# Patient Record
Sex: Female | Born: 1960
Health system: Southern US, Community
[De-identification: ages and names within clinical notes are randomized; demographics above are authoritative.]

## PROBLEM LIST (undated history)

## (undated) DIAGNOSIS — F419 Anxiety disorder, unspecified: Secondary | ICD-10-CM

## (undated) DIAGNOSIS — Q211 Atrial septal defect, unspecified: Secondary | ICD-10-CM

## (undated) DIAGNOSIS — Z8669 Personal history of other diseases of the nervous system and sense organs: Secondary | ICD-10-CM

## (undated) DIAGNOSIS — R51 Headache: Secondary | ICD-10-CM

## (undated) DIAGNOSIS — I493 Ventricular premature depolarization: Secondary | ICD-10-CM

## (undated) DIAGNOSIS — R112 Nausea with vomiting, unspecified: Secondary | ICD-10-CM

## (undated) DIAGNOSIS — I499 Cardiac arrhythmia, unspecified: Secondary | ICD-10-CM

## (undated) DIAGNOSIS — Z9889 Other specified postprocedural states: Secondary | ICD-10-CM

## (undated) DIAGNOSIS — R011 Cardiac murmur, unspecified: Secondary | ICD-10-CM

## (undated) HISTORY — PX: OTHER SURGICAL HISTORY: SHX169

## (undated) HISTORY — DX: Cardiac murmur, unspecified: R01.1

## (undated) HISTORY — PX: POSTERIOR REPAIR: SHX2254

## (undated) HISTORY — DX: Cardiac arrhythmia, unspecified: I49.9

## (undated) HISTORY — DX: Personal history of other diseases of the nervous system and sense organs: Z86.69

## (undated) HISTORY — PX: CARPAL TUNNEL RELEASE: SHX101

## (undated) HISTORY — PX: LAPAROSCOPY: SHX197

## (undated) HISTORY — PX: TUBAL LIGATION: SHX77

## (undated) HISTORY — PX: WISDOM TOOTH EXTRACTION: SHX21

---

## 2002-02-01 ENCOUNTER — Ambulatory Visit: Admission: RE | Admit: 2002-02-01 | Discharge: 2002-02-01 | Payer: Self-pay | Admitting: Family Medicine

## 2002-04-01 ENCOUNTER — Other Ambulatory Visit: Admission: RE | Admit: 2002-04-01 | Discharge: 2002-04-01 | Payer: Self-pay | Admitting: Obstetrics and Gynecology

## 2002-04-17 ENCOUNTER — Encounter: Payer: Self-pay | Admitting: Obstetrics and Gynecology

## 2002-04-17 ENCOUNTER — Encounter: Admission: RE | Admit: 2002-04-17 | Discharge: 2002-04-17 | Payer: Self-pay | Admitting: Obstetrics and Gynecology

## 2002-04-26 ENCOUNTER — Encounter: Payer: Self-pay | Admitting: Obstetrics and Gynecology

## 2002-04-26 ENCOUNTER — Encounter: Admission: RE | Admit: 2002-04-26 | Discharge: 2002-04-26 | Payer: Self-pay | Admitting: Obstetrics and Gynecology

## 2003-05-29 ENCOUNTER — Other Ambulatory Visit: Admission: RE | Admit: 2003-05-29 | Discharge: 2003-05-29 | Payer: Self-pay | Admitting: Obstetrics and Gynecology

## 2003-06-11 ENCOUNTER — Encounter: Admission: RE | Admit: 2003-06-11 | Discharge: 2003-06-11 | Payer: Self-pay | Admitting: Obstetrics and Gynecology

## 2003-06-11 ENCOUNTER — Encounter: Payer: Self-pay | Admitting: Obstetrics and Gynecology

## 2004-07-22 ENCOUNTER — Other Ambulatory Visit: Admission: RE | Admit: 2004-07-22 | Discharge: 2004-07-22 | Payer: Self-pay | Admitting: Obstetrics and Gynecology

## 2005-10-31 ENCOUNTER — Other Ambulatory Visit: Admission: RE | Admit: 2005-10-31 | Discharge: 2005-10-31 | Payer: Self-pay | Admitting: Obstetrics and Gynecology

## 2005-11-30 ENCOUNTER — Encounter: Admission: RE | Admit: 2005-11-30 | Discharge: 2005-11-30 | Payer: Self-pay | Admitting: Obstetrics and Gynecology

## 2008-10-07 ENCOUNTER — Encounter: Admission: RE | Admit: 2008-10-07 | Discharge: 2008-10-07 | Payer: Self-pay | Admitting: Obstetrics and Gynecology

## 2008-10-14 ENCOUNTER — Encounter: Admission: RE | Admit: 2008-10-14 | Discharge: 2008-10-14 | Payer: Self-pay | Admitting: Obstetrics and Gynecology

## 2010-08-19 ENCOUNTER — Encounter: Admission: RE | Admit: 2010-08-19 | Discharge: 2010-08-19 | Payer: Self-pay | Admitting: Obstetrics and Gynecology

## 2010-12-16 ENCOUNTER — Ambulatory Visit (HOSPITAL_COMMUNITY)
Admission: RE | Admit: 2010-12-16 | Discharge: 2010-12-16 | Payer: Self-pay | Source: Home / Self Care | Attending: Obstetrics and Gynecology | Admitting: Obstetrics and Gynecology

## 2011-04-01 ENCOUNTER — Ambulatory Visit: Payer: Self-pay | Admitting: Cardiology

## 2011-05-26 ENCOUNTER — Encounter: Payer: Self-pay | Admitting: Cardiology

## 2011-05-26 ENCOUNTER — Ambulatory Visit (INDEPENDENT_AMBULATORY_CARE_PROVIDER_SITE_OTHER): Payer: 59 | Admitting: Cardiology

## 2011-05-26 VITALS — BP 152/95 | HR 74 | Ht 63.0 in | Wt 141.8 lb

## 2011-05-26 DIAGNOSIS — Q211 Atrial septal defect: Secondary | ICD-10-CM

## 2011-05-26 DIAGNOSIS — I272 Pulmonary hypertension, unspecified: Secondary | ICD-10-CM

## 2011-05-26 DIAGNOSIS — I2789 Other specified pulmonary heart diseases: Secondary | ICD-10-CM

## 2011-05-26 NOTE — Patient Instructions (Signed)
Your physician recommends that you schedule a follow-up appointment in: 3 WEEKS WITH DR [facility]  Your physician has requested that you have an echocardiogram. Echocardiography is a painless test that uses sound waves to create images of your heart. It provides your doctor with information about the size and shape of your heart and how well your heart's chambers and valves are working. This procedure takes approximately one hour. There are no restrictions for this procedure. DX PULMONARY HYPERTENSION  Your physician recommends that you continue on your current medications as directed. Please refer to the Current Medication list given to you today.

## 2011-05-27 ENCOUNTER — Telehealth: Payer: Self-pay | Admitting: *Deleted

## 2011-05-27 DIAGNOSIS — Q211 Atrial septal defect, unspecified: Secondary | ICD-10-CM | POA: Insufficient documentation

## 2011-05-27 NOTE — Assessment & Plan Note (Signed)
Patient has a history of a small ASD seen by prior echo.  She does not some increased dyspnea with heavy exertion over the last year or so, but in general, she has very good exercise tolerance.  Left to right shunting can certainly increase over time as systemic blood pressure rises.  I will get an echocardiogram to assess for any dilation or dysfunction of the right ventricle and to assess PA pressure.  If the right side of her heart does appear enlarged, I will likely send her for a TEE with an eye towards percutaneous closure of the ASD.

## 2011-05-27 NOTE — Progress Notes (Signed)
PCP: Dr. Rosezetta Schlatter  50 yo with history of small ASD noted on prior echo a number of years ago presents for cardiology followup.  She has seen Dr. Deborah Chalk in the past.  She works as an Psychologist, educational at [employer].  Over the last year, she does report increased exertional dyspnea.  She feels like she is winded more easily with heavy yardwork and with running.  Overall, however, her exercise tolerance is excellent.  No syncope/lightheadedness.  No chest pain.  BP is high today, she attributes it to anxiety.  She checks her BP from time to time at work and systolic runs 120s-130s.   ECG: NSR, normal  PMH: 1. ASD: Small on prior echoes, last was several years ago.  2. PVCs 3. Anxiety  SH: Lives in [place], works as Psychologist, educational at [employer], has children, nonsmoker.    FH: Father with MI at 67, brother with ASD  ROS: All systems reviewed and negative except as per HPI.   Current Outpatient Prescriptions  Medication Sig Dispense Refill  . calcium-vitamin D (OSCAL WITH D) 500-200 MG-UNIT per tablet Take 1 tablet by mouth daily.        . Multiple Vitamins-Minerals (MULTIVITAMIN WITH MINERALS) tablet Take 1 tablet by mouth daily.          Pulse 74  Ht 5\' 3"  (1.6 m)  Wt 141 lb 12.8 oz (64.32 kg)  BMI 25.12 kg/m2 General: NAD Neck: No JVD, no thyromegaly or thyroid nodule.  Lungs: Clear to auscultation bilaterally with normal respiratory effort. CV: Nondisplaced PMI.  Heart regular S1/S2, no S3/S4, 1/6 SEM LLSB.  No peripheral edema.  No carotid bruit.  Normal pedal pulses.  Abdomen: Soft, nontender, no hepatosplenomegaly, no distention.  Skin: Intact without lesions or rashes.  Neurologic: Alert and oriented x 3.  Psych: Normal affect. Extremities: No clubbing or cyanosis.  HEENT: Normal.

## 2011-05-27 NOTE — Telephone Encounter (Signed)
Per pt she will set up her echo @ [facility] - She works in Radiology.

## 2011-05-30 ENCOUNTER — Ambulatory Visit (HOSPITAL_COMMUNITY)
Admission: RE | Admit: 2011-05-30 | Discharge: 2011-05-30 | Disposition: A | Discharge status: Home / Self Care | Payer: 59 | Source: Ambulatory Visit | Attending: Cardiology | Admitting: Cardiology

## 2011-05-30 DIAGNOSIS — Q211 Atrial septal defect: Secondary | ICD-10-CM | POA: Insufficient documentation

## 2011-05-30 DIAGNOSIS — Q2111 Secundum atrial septal defect: Secondary | ICD-10-CM

## 2011-05-30 DIAGNOSIS — I519 Heart disease, unspecified: Secondary | ICD-10-CM | POA: Insufficient documentation

## 2011-06-14 ENCOUNTER — Ambulatory Visit (INDEPENDENT_AMBULATORY_CARE_PROVIDER_SITE_OTHER): Payer: 59 | Admitting: Cardiology

## 2011-06-14 ENCOUNTER — Encounter: Payer: Self-pay | Admitting: Cardiology

## 2011-06-14 DIAGNOSIS — I10 Essential (primary) hypertension: Secondary | ICD-10-CM

## 2011-06-14 DIAGNOSIS — Q211 Atrial septal defect: Secondary | ICD-10-CM

## 2011-06-15 ENCOUNTER — Encounter: Payer: Self-pay | Admitting: Physician Assistant

## 2011-06-16 ENCOUNTER — Other Ambulatory Visit: Payer: 59 | Admitting: *Deleted

## 2011-06-16 ENCOUNTER — Ambulatory Visit: Payer: 59 | Admitting: Cardiology

## 2011-06-16 ENCOUNTER — Ambulatory Visit: Payer: 59 | Admitting: Physician Assistant

## 2011-06-16 DIAGNOSIS — I10 Essential (primary) hypertension: Secondary | ICD-10-CM | POA: Insufficient documentation

## 2011-06-16 NOTE — Assessment & Plan Note (Signed)
BP has been mildly elevated here in the office but she checks it several times a week at work and it has been within normal range.  Will follow carefully.

## 2011-06-16 NOTE — Assessment & Plan Note (Signed)
Echo appeared to show a small ASD versus large PFO.  There did not appear to be hemodynamic effect: RV was normal in size and function, RA was normal in size.  She has good exercise tolerance.  I do not think that we need to do a TEE at this point.  I will follow her symptomatically with a return appointment in 1 year.  If no new symptoms, repeat echo in 2 years.

## 2011-06-16 NOTE — Progress Notes (Signed)
PCP: Dr. Rosezetta Schlatter  50 yo with history of small ASD noted on prior echo a number of years ago presents for cardiology followup.  She works as an Psychologist, educational at [employer].  Over the last year, she feels like she is winded more easily with heavy yardwork and with running.  Overall, however, her exercise tolerance is excellent.  No syncope/lightheadedness.  No chest pain.  BP is high today, but she checks her BP from time to time at work and systolic runs 120s-130s.   She had an echo done this month which I reviewed today.  There does appear to be a small ASD versus large PFO.  The right ventricle and right atrium are normal in size.  Unable to estimate PA systolic pressure from this study.    ECG: NSR, normal (6/12)  PMH: 1. ASD: Echo (7/12) with EF 55-60%, normal LA size, normal RA size, normal RV size and systolic function, unable to estimate PA systolic pressure on this study, small ASD versus large PFO.  2. PVCs 3. 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    FH: Father with MI at 16, brother with ASD  Current Outpatient Prescriptions  Medication Sig Dispense Refill  . calcium-vitamin D (OSCAL WITH D) 500-200 MG-UNIT per tablet Take 1 tablet by mouth daily.        . Multiple Vitamins-Minerals (MULTIVITAMIN WITH MINERALS) tablet Take 1 tablet by mouth daily.          BP 148/98  Pulse 80  Resp 18  Ht 5\' 3"  (1.6 m)  Wt 141 lb (63.957 kg)  BMI 24.98 kg/m2 General: NAD Neck: No JVD, no thyromegaly or thyroid nodule.  Lungs: Clear to auscultation bilaterally with normal respiratory effort. CV: Nondisplaced PMI.  Heart regular S1/S2, no S3/S4, no murmur.  No peripheral edema.  No carotid bruit.  Normal pedal pulses.  Abdomen: Soft, nontender, no hepatosplenomegaly, no distention.  Neurologic: Alert and oriented x 3.  Psych: Normal affect. Extremities: No clubbing or cyanosis.

## 2011-08-30 ENCOUNTER — Other Ambulatory Visit (HOSPITAL_COMMUNITY): Payer: 59

## 2011-08-30 NOTE — Patient Instructions (Addendum)
   Your procedure is scheduled on:  Tuesday, September 06, 2011  Enter through the [employer] [facility] at: 8:30am Pick up the phone at the desk and dial [phone] and inform us of your arrival  Please call this number if you have any problems the morning of surgery: [phone]  Remember: Do not eat food after midnight  Monday, Oct. 8th Do not drink clear liquids after: Monday, Oct. 8th Take these medicines the morning of surgery with a SIP OF WATER: none  Do not wear jewelry, make-up, or FINGER nail polish Do not wear lotions, powders, or perfumes.  You may wear deodorant. Do not shave 48 hours prior to surgery. Do not bring valuables to the hospital.  Patients discharged on the day of surgery will not be allowed to drive home.   Name and phone number of your driver:  Shantee Hayne  cell  [phone]   Remember to use your hibiclens as instructed.Please shower with 1/2 bottle the evening before your surgery and the other 1/2 bottle the morning of surgery.

## 2011-09-01 ENCOUNTER — Encounter (HOSPITAL_COMMUNITY): Payer: Self-pay

## 2011-09-01 ENCOUNTER — Encounter (HOSPITAL_COMMUNITY)
Admission: RE | Admit: 2011-09-01 | Discharge: 2011-09-01 | Disposition: A | Discharge status: Home / Self Care | Payer: 59 | Source: Ambulatory Visit | Attending: Obstetrics and Gynecology | Admitting: Obstetrics and Gynecology

## 2011-09-01 HISTORY — DX: Other specified postprocedural states: Z98.890

## 2011-09-01 HISTORY — DX: Headache: R51

## 2011-09-01 HISTORY — DX: Other specified postprocedural states: R11.2

## 2011-09-01 HISTORY — DX: Anxiety disorder, unspecified: F41.9

## 2011-09-01 HISTORY — DX: Atrial septal defect: Q21.1

## 2011-09-01 HISTORY — DX: Atrial septal defect, unspecified: Q21.10

## 2011-09-01 HISTORY — DX: Ventricular premature depolarization: I49.3

## 2011-09-01 LAB — BASIC METABOLIC PANEL
BUN: 13 mg/dL (ref 6–23)
Chloride: 100 mEq/L (ref 96–112)
GFR calc Af Amer: >90 mL/min (ref >90)
Potassium: 4 mEq/L (ref 3.5–5.1)

## 2011-09-01 LAB — CBC
HCT: 43.3 % (ref 36.0–46.0)
Hemoglobin: 14.7 g/dL (ref 12.0–15.0)
WBC: 11.4 10*3/uL — ABNORMAL HIGH (ref 4.0–10.5)

## 2011-09-01 NOTE — Pre-Procedure Instructions (Signed)
Reviewed patient's history via phone with Dr Malen Gauze.  Ok to see patient DOS.  Copy of Echo and EKG on chart.  Patient has Atrial Septal Defect.

## 2011-09-06 ENCOUNTER — Encounter (HOSPITAL_COMMUNITY)
Admission: RE | Disposition: A | Discharge status: Home / Self Care | Payer: Self-pay | Source: Ambulatory Visit | Attending: Obstetrics and Gynecology

## 2011-09-06 ENCOUNTER — Ambulatory Visit (HOSPITAL_COMMUNITY): Payer: 59 | Admitting: Anesthesiology

## 2011-09-06 ENCOUNTER — Ambulatory Visit (HOSPITAL_COMMUNITY)
Admission: RE | Admit: 2011-09-06 | Discharge: 2011-09-06 | Disposition: A | Discharge status: Home / Self Care | Payer: 59 | Source: Ambulatory Visit | Attending: Obstetrics and Gynecology | Admitting: Obstetrics and Gynecology

## 2011-09-06 ENCOUNTER — Encounter (HOSPITAL_COMMUNITY): Payer: Self-pay | Admitting: *Deleted

## 2011-09-06 ENCOUNTER — Encounter (HOSPITAL_COMMUNITY): Payer: Self-pay | Admitting: Anesthesiology

## 2011-09-06 ENCOUNTER — Other Ambulatory Visit: Payer: Self-pay | Admitting: Obstetrics and Gynecology

## 2011-09-06 DIAGNOSIS — D251 Intramural leiomyoma of uterus: Secondary | ICD-10-CM | POA: Insufficient documentation

## 2011-09-06 DIAGNOSIS — N920 Excessive and frequent menstruation with regular cycle: Secondary | ICD-10-CM | POA: Insufficient documentation

## 2011-09-06 DIAGNOSIS — Z01812 Encounter for preprocedural laboratory examination: Secondary | ICD-10-CM | POA: Insufficient documentation

## 2011-09-06 DIAGNOSIS — Z01818 Encounter for other preprocedural examination: Secondary | ICD-10-CM | POA: Insufficient documentation

## 2011-09-06 SURGERY — DILATATION & CURETTAGE/HYSTEROSCOPY WITH HYDROTHERMAL ABLATION
Anesthesia: General | Site: Vagina | Wound class: Clean Contaminated

## 2011-09-06 MED ORDER — MIDAZOLAM HCL 2 MG/2ML IJ SOLN
INTRAMUSCULAR | Status: AC
  Filled 2011-09-06: qty 2

## 2011-09-06 MED ORDER — ONDANSETRON HCL 4 MG/2ML IJ SOLN
INTRAMUSCULAR | Status: DC | PRN
  Administered 2011-09-06: 4 mg via INTRAVENOUS

## 2011-09-06 MED ORDER — LIDOCAINE HCL (CARDIAC) 20 MG/ML IV SOLN
INTRAVENOUS | Status: DC | PRN
  Administered 2011-09-06: 50 mg via INTRAVENOUS

## 2011-09-06 MED ORDER — DEXAMETHASONE SODIUM PHOSPHATE 10 MG/ML IJ SOLN
INTRAMUSCULAR | Status: AC
  Filled 2011-09-06: qty 1

## 2011-09-06 MED ORDER — DEXAMETHASONE SODIUM PHOSPHATE 4 MG/ML IJ SOLN
INTRAMUSCULAR | Status: DC | PRN
  Administered 2011-09-06: 6 mg via INTRAVENOUS

## 2011-09-06 MED ORDER — ONDANSETRON HCL 4 MG/2ML IJ SOLN
INTRAMUSCULAR | Status: AC
  Filled 2011-09-06: qty 2

## 2011-09-06 MED ORDER — LIDOCAINE HCL (CARDIAC) 20 MG/ML IV SOLN
INTRAVENOUS | Status: AC
  Filled 2011-09-06: qty 5

## 2011-09-06 MED ORDER — SCOPOLAMINE 1 MG/3DAYS TD PT72
MEDICATED_PATCH | TRANSDERMAL | Status: AC
  Filled 2011-09-06: qty 1

## 2011-09-06 MED ORDER — FENTANYL CITRATE 0.05 MG/ML IJ SOLN
1.0000 ug/kg | INTRAMUSCULAR | Status: DC | PRN
  Administered 2011-09-06: 50 ug via INTRAVENOUS

## 2011-09-06 MED ORDER — FENTANYL CITRATE 0.05 MG/ML IJ SOLN
INTRAMUSCULAR | Status: DC | PRN
  Administered 2011-09-06: 100 ug via INTRAVENOUS
  Administered 2011-09-06: 50 ug via INTRAVENOUS

## 2011-09-06 MED ORDER — HYDROCODONE-ACETAMINOPHEN 5-500 MG PO TABS: 1.0000 | ORAL_TABLET | Freq: Four times a day (QID) | ORAL | Status: AC | PRN

## 2011-09-06 MED ORDER — PROPOFOL 10 MG/ML IV EMUL
INTRAVENOUS | Status: AC
  Filled 2011-09-06: qty 20

## 2011-09-06 MED ORDER — SCOPOLAMINE 1 MG/3DAYS TD PT72
1.0000 | MEDICATED_PATCH | TRANSDERMAL | Status: DC
  Administered 2011-09-06: 1.5 mg via TRANSDERMAL

## 2011-09-06 MED ORDER — KETOROLAC TROMETHAMINE 30 MG/ML IJ SOLN
INTRAMUSCULAR | Status: DC | PRN
  Administered 2011-09-06: 30 mg via INTRAVENOUS

## 2011-09-06 MED ORDER — FENTANYL CITRATE 0.05 MG/ML IJ SOLN
INTRAMUSCULAR | Status: AC
  Filled 2011-09-06: qty 2

## 2011-09-06 MED ORDER — LACTATED RINGERS IV SOLN
INTRAVENOUS | Status: DC
  Administered 2011-09-06: 09:00:00 via INTRAVENOUS

## 2011-09-06 MED ORDER — MIDAZOLAM HCL 5 MG/5ML IJ SOLN
INTRAMUSCULAR | Status: DC | PRN
  Administered 2011-09-06: 2 mg via INTRAVENOUS

## 2011-09-06 MED ORDER — FENTANYL CITRATE 0.05 MG/ML IJ SOLN
INTRAMUSCULAR | Status: AC
  Filled 2011-09-06: qty 5

## 2011-09-06 MED ORDER — LIDOCAINE HCL 2 % IJ SOLN
INTRAMUSCULAR | Status: DC | PRN
  Administered 2011-09-06: 16 mL

## 2011-09-06 MED ORDER — PROPOFOL 10 MG/ML IV EMUL
INTRAVENOUS | Status: DC | PRN
  Administered 2011-09-06: 200 mg via INTRAVENOUS

## 2011-09-06 MED ORDER — KETOROLAC TROMETHAMINE 60 MG/2ML IM SOLN
INTRAMUSCULAR | Status: AC
  Filled 2011-09-06: qty 2

## 2011-09-06 MED ORDER — SODIUM CHLORIDE 0.9 % IR SOLN
Status: DC | PRN
  Administered 2011-09-06: 3000 mL

## 2011-09-06 SURGICAL SUPPLY — 16 items
BLADE SURG 11 STRL SS (BLADE) ×2 IMPLANT
CATH ROBINSON RED A/P 16FR (CATHETERS) ×2 IMPLANT
CLOTH BEACON ORANGE TIMEOUT ST (SAFETY) ×2 IMPLANT
CONTAINER PREFILL 10% NBF 60ML (FORM) ×3 IMPLANT
DRAPE CAMERA CLOSED 9X96 (DRAPES) ×2 IMPLANT
DRAPE HYSTEROSCOPY (DRAPE) ×2 IMPLANT
DRAPE UTILITY XL STRL (DRAPES) ×2 IMPLANT
GLOVE ORTHO TXT STRL SZ7.5 (GLOVE) ×4 IMPLANT
GOWN PREVENTION PLUS LG XLONG (DISPOSABLE) ×2 IMPLANT
GOWN STRL REIN XL XLG (GOWN DISPOSABLE) ×2 IMPLANT
NDL SPNL 22GX3.5 QUINCKE BK (NEEDLE) ×1 IMPLANT
NEEDLE SPNL 22GX3.5 QUINCKE BK (NEEDLE) ×2 IMPLANT
PACK VAGINAL MINOR WOMEN LF (CUSTOM PROCEDURE TRAY) ×2 IMPLANT
SET GENESYS HTA PROCERVA (MISCELLANEOUS) ×2 IMPLANT
SYR CONTROL 10ML LL (SYRINGE) ×2 IMPLANT
TOWEL OR 17X24 6PK STRL BLUE (TOWEL DISPOSABLE) ×4 IMPLANT

## 2011-09-06 NOTE — H&P (Signed)
Traci Roberts is an 50 y.o. female. She has been evaluated for heavy menses.  Ultrasound has small myomas with minimal submucosal component.  Options have been discussed, she wishes to proceed with endometrial ablation.  Pertinent Gynecological History: Menses: heavy, regular Contraception: tubal ligation Previous GYN Procedures: cone biopsy, laparoscopy, posterior repair  OB History: G3, P2012   Menstrual History: Menarche age: 68 No LMP recorded.    Past Medical History  Diagnosis Date  . Arrhythmia   . Heart murmur   . History of migraines   . ASD (atrial septal defect)     F/U with Dr Shirlee Latch in July 2012  . Headache     OTC meds prn  . PONV (postoperative nausea and vomiting)   . PVC's (premature ventricular contractions)   . Anxiety     no meds  Cervical dysplasia- 1992, normal since cone biopsy  Past Surgical History  Procedure Date  . Laparoscopy   . Carpal tunnel release   . Wisdom tooth extraction   . Svd     x 2  . Tubal ligation   . Posterior repair   Cone biopsy  Family History  Problem Relation Age of Onset  . Cancer Father   . Lymphoma Father   . Hypertension Sister     Social History:  reports that she has never smoked. She has never used smokeless tobacco. She reports that she drinks alcohol. She reports that she does not use illicit drugs.  Allergies:  Allergies  Allergen Reactions  . Latex Anaphylaxis    Prescriptions prior to admission  Medication Sig Dispense Refill  . calcium-vitamin D (OSCAL WITH D) 500-200 MG-UNIT per tablet Take 1 tablet by mouth daily.        . Multiple Vitamins-Minerals (MULTIVITAMIN WITH MINERALS) tablet Take 1 tablet by mouth daily.          Review of Systems  Respiratory: Negative.   Cardiovascular: Negative.   Gastrointestinal: Negative.   Genitourinary: Negative.     Blood pressure 154/88, pulse 74, temperature 98.1 F (36.7 C), temperature source Oral, resp. rate 18, SpO2 98.00%. Physical Exam    Constitutional: She appears well-developed and well-nourished.  Neck: Neck supple. No thyromegaly present.  Cardiovascular: Normal rate, regular rhythm and normal heart sounds.   No murmur heard. Respiratory: Breath sounds normal. No respiratory distress. She has no wheezes.  GI: Soft. She exhibits no distension and no mass. There is no tenderness.  Genitourinary: Vagina normal and uterus normal.    No results found for this or any previous visit (from the past 24 hour(s)).  No results found.  Assessment/Plan: Persistent menorrhagia with small mostly intramural myomas.  All medical and surgical options have been discussed.  She wishes to proceed with endometrial ablation with HTA.  The procedure and risks have been discussed.  We have discussed about 60% chance of amenorrhea, 30-35% chance of improved flow and 5-10% chance of no improvement.  Will proceed with HTA.  Deep Bonawitz D 09/06/2011, 8:31 AM

## 2011-09-06 NOTE — Anesthesia Preprocedure Evaluation (Signed)
Anesthesia Evaluation  Name, MR# and DOB Patient awake  General Assessment Comment  Reviewed: Allergy & Precautions, H&P , Patient's Chart, lab work & pertinent test results, reviewed documented beta blocker date and time   Airway Mallampati: II TM Distance: >3 FB Neck ROM: full    Dental No notable dental hx.    Pulmonary  clear to auscultation  Pulmonary exam normal       Cardiovascular regular Normal    Neuro/Psych    GI/Hepatic   Endo/Other    Renal/GU      Musculoskeletal   Abdominal   Peds  Hematology   Anesthesia Other Findings   Reproductive/Obstetrics                           Anesthesia Physical Anesthesia Plan  ASA: II  Anesthesia Plan: General   Post-op Pain Management:    Induction: Intravenous  Airway Management Planned: Oral ETT  Additional Equipment:   Intra-op Plan:   Post-operative Plan:   Informed Consent: I have reviewed the patients History and Physical, chart, labs and discussed the procedure including the risks, benefits and alternatives for the proposed anesthesia with the patient or authorized representative who has indicated his/her understanding and acceptance.   Dental Advisory Given  Plan Discussed with: CRNA and Surgeon  Anesthesia Plan Comments: (  Discussed  general anesthesia, including possible nausea, instrumentation of airway, sore throat,pulmonary aspiration, etc. I asked if the were any outstanding questions, or  concerns before we proceeded. )        Anesthesia Quick Evaluation  

## 2011-09-06 NOTE — Progress Notes (Signed)
History reviewed, pt examined, no change in status.  Will proceed with HTA.

## 2011-09-06 NOTE — Op Note (Signed)
Preoperative diagnosis: Menorrhagia, fibroids Postoperative diagnosis: Menorrhagia, fibroids Procedure: Hysteroscopy with D&C, endometrial ablation with [facility] Surgeon: Lavina Hamman M.D. Anesthesia: Gen. With an LMA, deep paracervical block Findings: She had a fairly normal endometrial cavity. There was a fair amount of endometrial tissue. No significant submucosal myomas were noted. Specimens: Endometrial curettings sent for routine pathology Estimated blood loss: Minimal Complications: None  Procedure in detail: The patient was taken to the operating room and placed in the dorsosupine position. General anesthesia was induced. She was placed in mobile stirrups and legs were elevated. Perineum and vagina were then prepped and draped in the usual sterile fashion, bladder drained with a red Robinson catheter. A Graves speculum was inserted in the vagina. The anterior lip of the cervix was grasped with a single-tooth tenaculum. Deep paracervical block was then performed with a total of 16 cc of 2% plain lidocaine. She was also given IV Toradol. Cervix was stenotic and this was released with a sharp scalpel. Uterus then sounded to 8-9 cm. Cervix was gradually dilated to size 25 dilator without difficulty. The HTA hysteroscope was inserted and good visualization was achieved. The hysteroscope was then removed. Sharp curettage was then performed with return of small amount of tissue. The HTA scope was then reinserted and good visualization and good cervical seal was obtained. Ablation was then carried out as per instructions on the machine without any difficulty at all. There was no fluid leakage throughout the procedure. At the end of the procedure there appeared to be good global endometrial ablation. The hysteroscope was removed. The single-tooth tenaculum was removed from the cervix and bleeding was controlled with pressure. All instruments were then removed from the vagina. The patient  tolerated procedure well and was taken to the recovery room in stable condition. Counts were correct and she had PAS hose on throughout the procedure.

## 2011-09-06 NOTE — Transfer of Care (Signed)
Immediate Anesthesia Transfer of Care Note  Patient: Traci Roberts  Procedure(s) Performed:  DILATATION & CURETTAGE/HYSTEROSCOPY WITH HYDROTHERMAL ABLATION - Hydrothermal ablation  Patient Location: PACU  Anesthesia Type: General  Level of Consciousness: sedated  Airway & Oxygen Therapy: Patient Spontanous Breathing and Patient connected to nasal cannula oxygen  Post-op Assessment: Report given to PACU RN and Post -op Vital signs reviewed and stable  Post vital signs: Reviewed and stable  Complications: No apparent anesthesia complications

## 2011-09-06 NOTE — OR Nursing (Signed)
Debbie New RN running HTA during OR procedure.

## 2011-09-06 NOTE — Anesthesia Postprocedure Evaluation (Signed)
Anesthesia Post Note  Patient: Traci Roberts  Procedure(s) Performed:  DILATATION & CURETTAGE/HYSTEROSCOPY WITH HYDROTHERMAL ABLATION - Hydrothermal ablation  Anesthesia type: General  Patient location: PACU  Post pain: Pain level controlled  Post assessment: Post-op Vital signs reviewed  Last Vitals:  Filed Vitals:   09/06/11 1200  BP: 153/85  Pulse: 84  Temp: 98.3 F (36.8 C)  Resp: 16    Post vital signs: Reviewed  Level of consciousness: sedated  Complications: No apparent anesthesia complications

## 2012-04-04 IMAGING — US US TRANSVAGINAL NON-OB
1 series · 13 of 25 positions shown · non-contrast
Comparison: None.

CLINICAL DATA: Menorrhagia.  Fibroids.



[Series 1: us transvaginal non-ob · 13 of 61 slices shown]
[im 1/61]
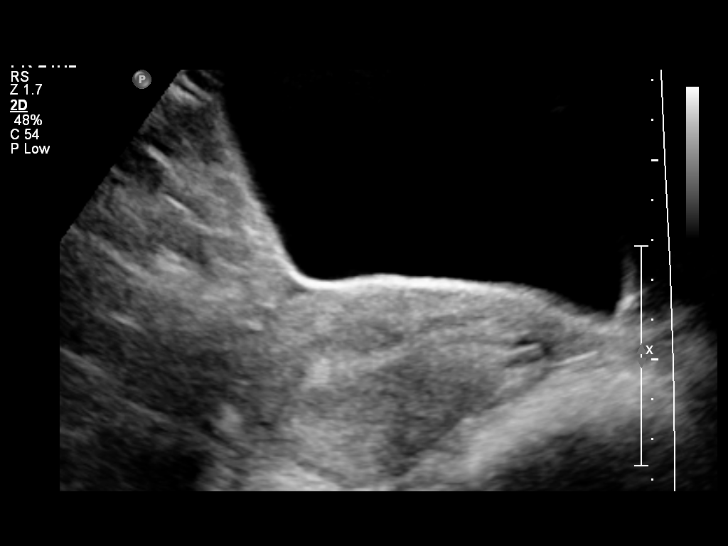
[im 6/61]
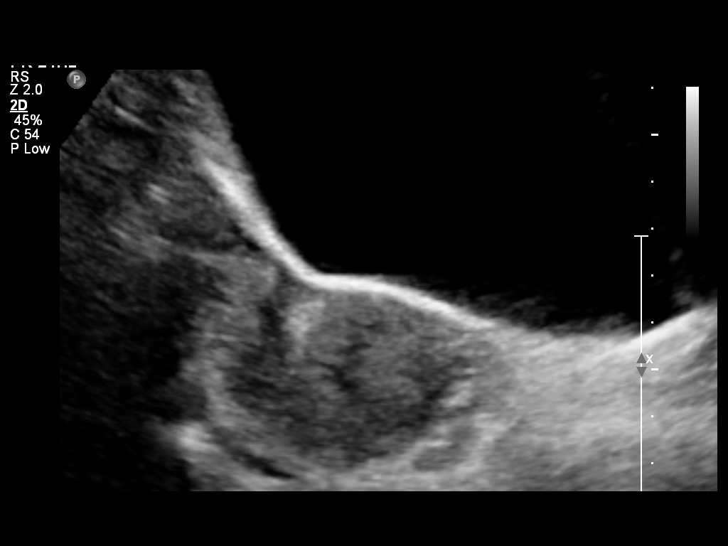
[im 11/61]
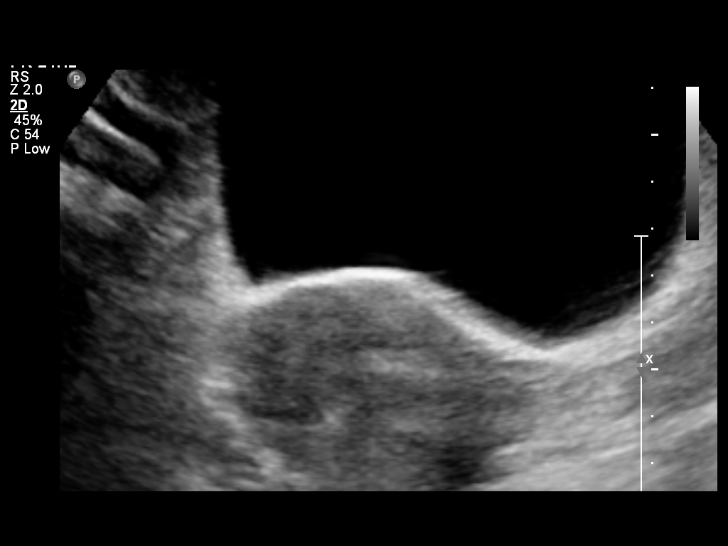
[im 16/61]
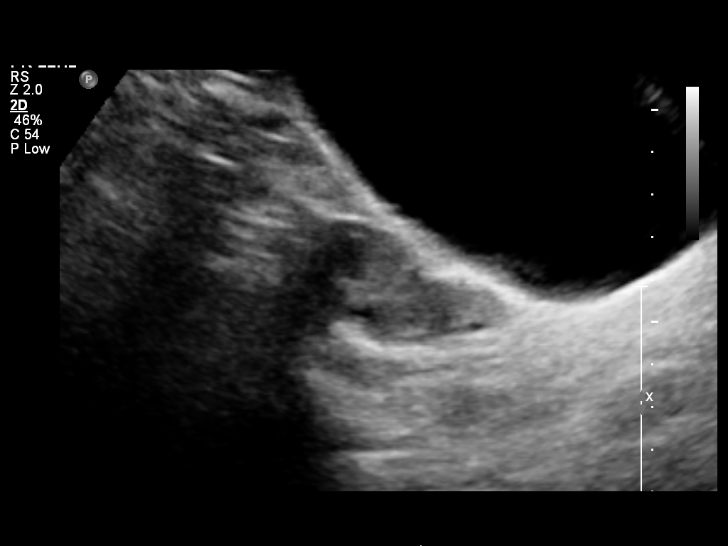
[im 21/61]
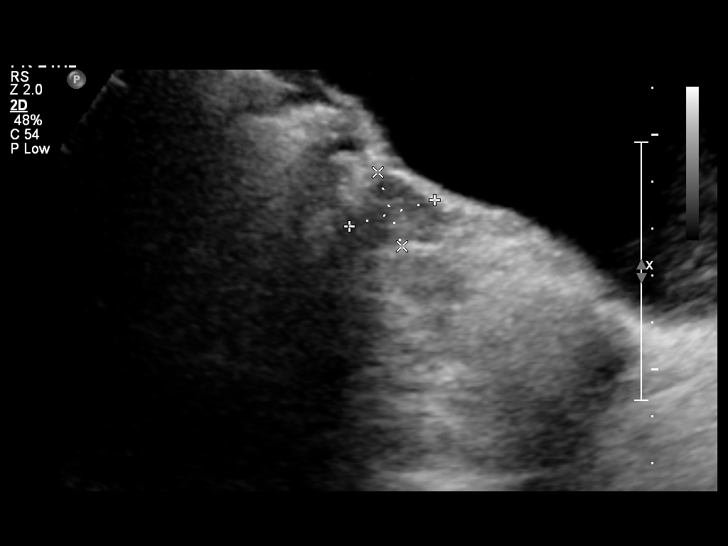
[im 26/61]
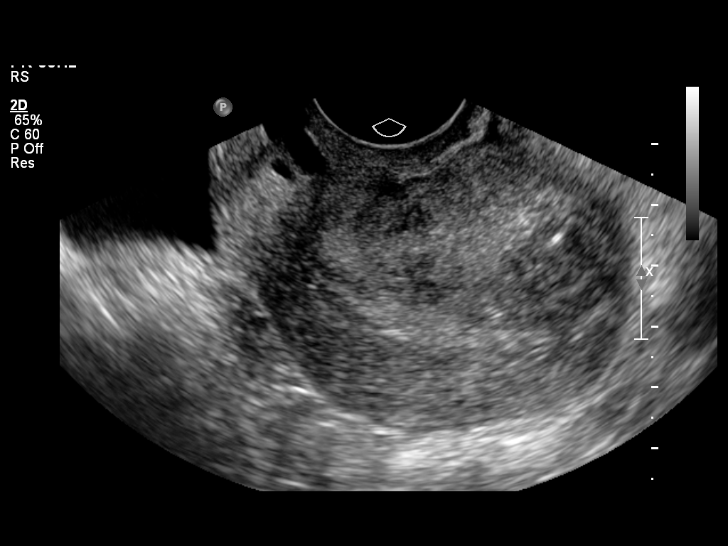
[im 31/61]
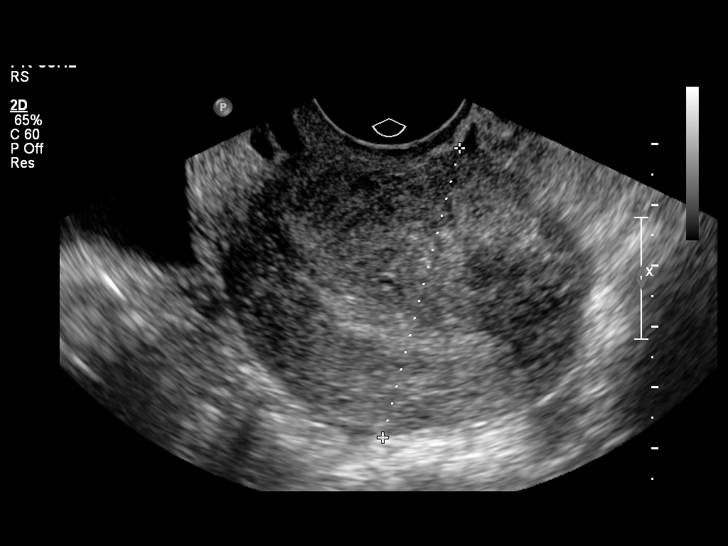
[im 36/61]
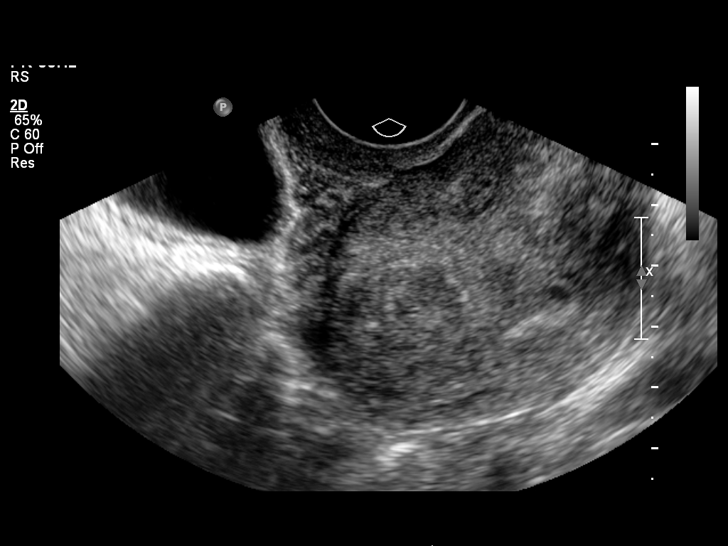
[im 41/61]
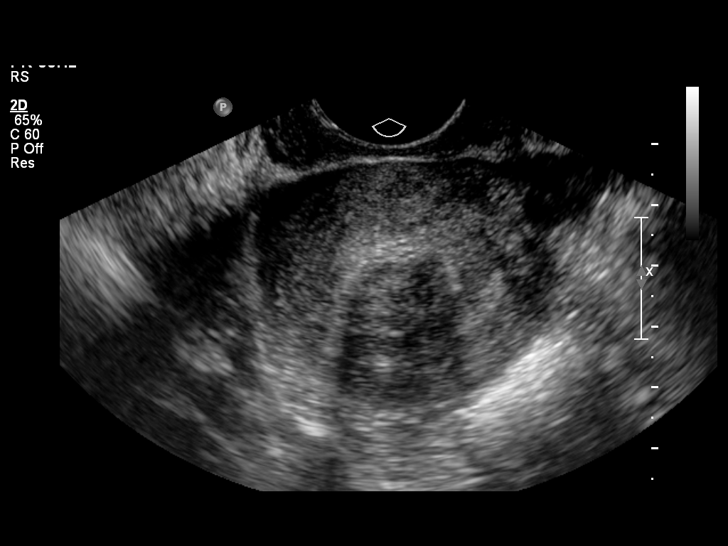
[im 46/61]
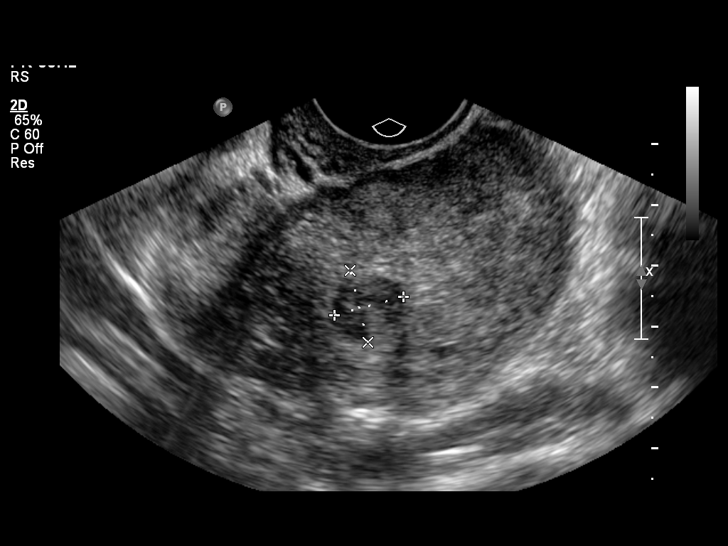
[im 51/61]
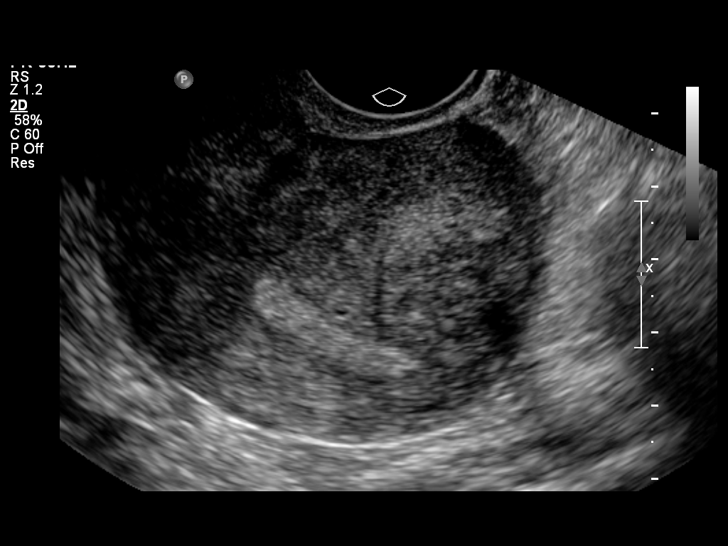
[im 56/61]
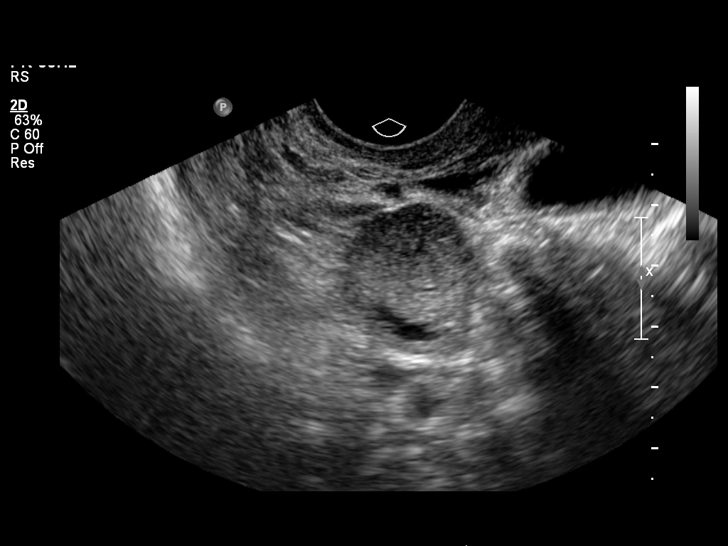
[im 61/61]
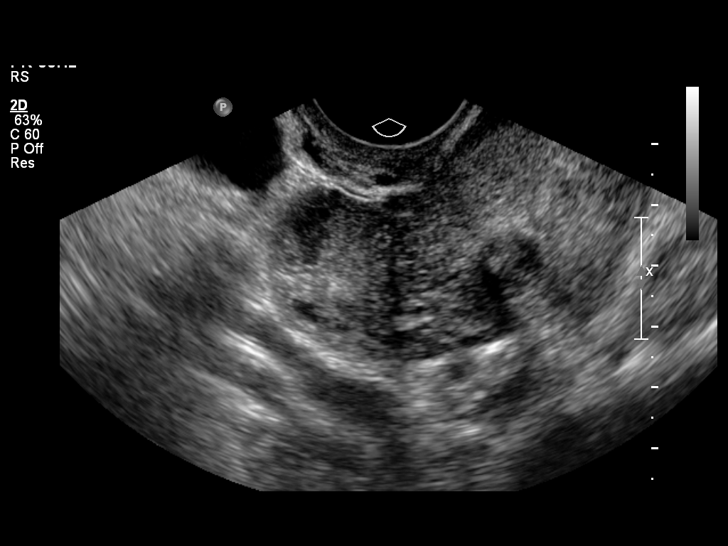

[13 of 25 positions shown; findings below may reference images not displayed]

FINDINGS: Uterus measures 7.9 x 4.9 x 5.7 cm. The uterus is retroverted.  A
fibroid isseen in the uterine fundus which measures 2.1 cm in
maximum diameter.  A second fibroid in the posterior uterine body
measures 2.8 cm in maximum diameter.  A third smaller fibroid is
seen in the posterior lower uterine segment which measures 1.2 cm
in diameter.  All of these fibroids are predominately intramural in
location, but have partial submucosal components representing less
than 50% there is surface area.

Endometrium measures 4 mm in thickness.  Within normal limits in
appearance.

Right Ovary measures 2.9 x 2.3 x 2.5 cm. Normal appearance.

Left Ovary measures 2.2 x 1.3 x 1.6 cm.  Normal appearance.

Other Findings:  No other abnormality identified.
IMPRESSION: 1.  Retroverted uterus with three fibroids measuring up to 2.8 cm.
Each these fibroids is predominately intramural in location, with
partial < 50% submucosal components.
2.  Normal ovaries.  No evidence of adnexal mass or free fluid.

## 2012-07-20 ENCOUNTER — Telehealth: Payer: Self-pay | Admitting: Cardiology

## 2012-07-20 NOTE — Telephone Encounter (Signed)
New Problem:    I called the patient and was told that the patient was doing fine, and would prefer to wait until after the first of the year to schedule an appointment with Dr. Shirlee Latch.

## 2012-08-23 ENCOUNTER — Other Ambulatory Visit: Payer: Self-pay | Admitting: Obstetrics and Gynecology

## 2012-08-23 DIAGNOSIS — Z1231 Encounter for screening mammogram for malignant neoplasm of breast: Secondary | ICD-10-CM

## 2012-09-21 ENCOUNTER — Ambulatory Visit
Admission: RE | Admit: 2012-09-21 | Discharge: 2012-09-21 | Disposition: A | Discharge status: Home / Self Care | Payer: 59 | Source: Ambulatory Visit | Attending: Obstetrics and Gynecology | Admitting: Obstetrics and Gynecology

## 2012-09-21 DIAGNOSIS — Z1231 Encounter for screening mammogram for malignant neoplasm of breast: Secondary | ICD-10-CM

## 2012-10-19 ENCOUNTER — Encounter: Payer: Self-pay | Admitting: Gastroenterology

## 2012-12-07 ENCOUNTER — Encounter: Payer: 59 | Admitting: Gastroenterology

## 2014-05-09 ENCOUNTER — Encounter: Payer: Self-pay | Admitting: Family Medicine

## 2014-05-09 ENCOUNTER — Ambulatory Visit (INDEPENDENT_AMBULATORY_CARE_PROVIDER_SITE_OTHER): Payer: 59 | Admitting: Family Medicine

## 2014-05-09 VITALS — BP 142/84 | HR 78 | Temp 98.1°F | Resp 16 | Ht 63.0 in | Wt 153.0 lb

## 2014-05-09 DIAGNOSIS — G4700 Insomnia, unspecified: Secondary | ICD-10-CM

## 2014-05-09 DIAGNOSIS — Q828 Other specified congenital malformations of skin: Secondary | ICD-10-CM

## 2014-05-09 DIAGNOSIS — D4701 Cutaneous mastocytosis: Secondary | ICD-10-CM

## 2014-05-09 MED ORDER — LORATADINE 10 MG PO TABS: 10.0000 mg | ORAL_TABLET | Freq: Every day | ORAL | Status: DC

## 2014-05-09 MED ORDER — ALPRAZOLAM 0.5 MG PO TABS: 0.5000 mg | ORAL_TABLET | Freq: Every day | ORAL | Status: DC

## 2014-05-09 MED ORDER — PREDNISONE 20 MG PO TABS: ORAL_TABLET | ORAL | Status: DC

## 2014-05-09 NOTE — Progress Notes (Signed)
Subjective:    Patient ID: Traci Roberts, female    DOB: September 29, 1961, 53 y.o.   MRN: [id number]  HPI Patient has been breaking out in hives on her face and her neck and her upper chest on a daily basis for over one month. At the present time she has erythematous urticaria on her neck and her upper chest. There been no changes in her medications. There've been no dietary changes. She is mainly changes in perfumes, detergents, as are segs. There been no environmental exposures that she could be allergic to. There is no other rash anywhere on the body. That she has been under tremendous stress recently. Her father is battling cancer. Her mother-in-law has been in the hospital battling pneumonia. She's under tremendous stress at work. She is having insomnia on a nightly basis. Past Medical History  Diagnosis Date  . Arrhythmia   . Heart murmur   . History of migraines   . ASD (atrial septal defect)     F/U with Dr Aundra Dubin in July 2012  . Headache(784.0)     OTC meds prn  . PONV (postoperative nausea and vomiting)   . PVC's (premature ventricular contractions)   . Anxiety     no meds   Current Outpatient Prescriptions on File Prior to Visit  Medication Sig Dispense Refill  . calcium-vitamin D (OSCAL WITH D) 500-200 MG-UNIT per tablet Take 1 tablet by mouth daily.        . Multiple Vitamins-Minerals (MULTIVITAMIN WITH MINERALS) tablet Take 1 tablet by mouth daily.         No current facility-administered medications on file prior to visit.   Allergies  Allergen Reactions  . Latex Anaphylaxis   History   Social History  . Marital Status: Divorced    Spouse Name: N/A    Number of Children: N/A  . Years of Education: N/A   Occupational History  . Not on file.   Social History Main Topics  . Smoking status: Never Smoker   . Smokeless tobacco: Never Used  . Alcohol Use: Yes     Comment:  2-3 glasses per week - wine  . Drug Use: No  . Sexual Activity: Yes    Birth Control/  Protection: Surgical   Other Topics Concern  . Not on file   Social History Narrative  . No narrative on file      Review of Systems  All other systems reviewed and are negative.      Objective:   Physical Exam  Vitals reviewed. Cardiovascular: Normal rate, regular rhythm and normal heart sounds.   Pulmonary/Chest: Effort normal and breath sounds normal.  Skin: Rash noted. There is erythema.   Rash on the neck and upper chest as described in the history of present illness       Assessment & Plan:  1. Urticaria  I believe the patient is having urticaria, I am unsure of the etiology. My primary suspicion is cholinergic urticaria due to stress.  Temporarily treat the urticaria prednisone taper pack and Claritin. - predniSONE (DELTASONE) 20 MG tablet; 3 tabs poqday 1-2, 2 tabs poqday 3-4, 1 tab poqday 5-6  Dispense: 12 tablet; Refill: 0 - loratadine (CLARITIN) 10 MG tablet; Take 1 tablet (10 mg total) by mouth daily.  Dispense: 30 tablet; Refill: 11  2. Insomnia Also treat her insomnia with Xanax 0.5 mg by mouth each bedtime when necessary anxiety or insomnia. Consider treating stress more aggressively if the urticaria is in fact  related to stress. - ALPRAZolam (XANAX) 0.5 MG tablet; Take 1 tablet (0.5 mg total) by mouth at bedtime.  Dispense: 30 tablet; Refill: 0

## 2015-12-25 ENCOUNTER — Other Ambulatory Visit: Payer: Self-pay

## 2015-12-25 DIAGNOSIS — Z1231 Encounter for screening mammogram for malignant neoplasm of breast: Secondary | ICD-10-CM

## 2016-01-13 ENCOUNTER — Ambulatory Visit
Admission: RE | Admit: 2016-01-13 | Discharge: 2016-01-13 | Disposition: A | Discharge status: Home / Self Care | Payer: 59 | Source: Ambulatory Visit

## 2016-01-13 DIAGNOSIS — Z1231 Encounter for screening mammogram for malignant neoplasm of breast: Secondary | ICD-10-CM

## 2016-01-20 ENCOUNTER — Ambulatory Visit (INDEPENDENT_AMBULATORY_CARE_PROVIDER_SITE_OTHER): Payer: 59 | Admitting: Physician Assistant

## 2016-01-20 ENCOUNTER — Encounter: Payer: Self-pay | Admitting: Physician Assistant

## 2016-01-20 VITALS — BP 114/76 | HR 80 | Temp 97.8°F | Resp 18 | Ht 62.0 in | Wt 149.0 lb

## 2016-01-20 DIAGNOSIS — Z0000 Encounter for general adult medical examination without abnormal findings: Secondary | ICD-10-CM | POA: Diagnosis not present

## 2016-01-20 NOTE — Progress Notes (Signed)
Patient ID: AZJA MAXIM MRN: [id number], DOB: 1961/02/18, 55 y.o. Date of Encounter: 01/20/2016,   Chief Complaint: Physical (CPE)  HPI: 55 y.o. y/o white female  here for CPE.   She has not had an office visit here in a couple years. She states that she has not been seeing any other medical providers during that time. Says that she realizes that she really needs to come in and have a physical and update preventive care.  Says that she does have an appointment to see Dr. Willis Modena for GYN exam--says that appointment is in March.  She has no complaints or concerns today.  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   Review of Systems: Consitutional: No fever, chills, fatigue, night sweats, lymphadenopathy. No significant/unexplained weight changes. Eyes: No visual changes, eye redness, or discharge. ENT/Mouth: No ear pain, sore throat, nasal drainage, or sinus pain. Cardiovascular: No chest pressure,heaviness, tightness or squeezing, even with exertion. No increased shortness of breath or dyspnea on exertion.No palpitations, edema, orthopnea, PND. Respiratory: No cough, hemoptysis, SOB, or wheezing. Gastrointestinal: No anorexia, dysphagia, reflux, pain, nausea, vomiting, hematemesis, diarrhea, constipation, BRBPR, or melena. Breast: No mass, nodules, bulging, or retraction. No skin changes or inflammation. No nipple discharge. No lymphadenopathy. Genitourinary: No dysuria, hematuria, incontinence, vaginal discharge, pruritis, burning, abnormal bleeding, or pain. Musculoskeletal: No decreased ROM, No joint pain or swelling. No significant pain in neck, back, or extremities. Skin: No rash, pruritis, or concerning lesions. Neurological: No headache, dizziness, syncope, seizures, tremors, memory loss, coordination problems, or paresthesias. Psychological: No anxiety, depression, hallucinations, SI/HI. Endocrine: No polydipsia,  polyphagia, polyuria, or known diabetes.No increased fatigue. No palpitations/rapid heart rate. No significant/unexplained weight change. All other systems were reviewed and are otherwise negative.  Past Medical History  Diagnosis Date  . Arrhythmia   . Heart murmur   . History of migraines   . ASD (atrial septal defect)     F/U with Dr Aundra Dubin in July 2012  . Headache(784.0)     OTC meds prn  . PONV (postoperative nausea and vomiting)   . PVC's (premature ventricular contractions)   . Anxiety     no meds     Past Surgical History  Procedure Laterality Date  . Laparoscopy    . Carpal tunnel release    . Wisdom tooth extraction    . Svd      x 2  . Tubal ligation    . Posterior repair      Home Meds:  Outpatient Prescriptions Prior to Visit  Medication Sig Dispense Refill  . calcium-vitamin D (OSCAL WITH D) 500-200 MG-UNIT per tablet Take 1 tablet by mouth daily.      Marland Kitchen loratadine (CLARITIN) 10 MG tablet Take 1 tablet (10 mg total) by mouth daily. 30 tablet 11  . Multiple Vitamins-Minerals (MULTIVITAMIN WITH MINERALS) tablet Take 1 tablet by mouth daily.      Marland Kitchen ALPRAZolam (XANAX) 0.5 MG tablet Take 1 tablet (0.5 mg total) by mouth at bedtime. (Patient not taking: Reported on 01/20/2016) 30 tablet 0  . predniSONE (DELTASONE) 20 MG tablet 3 tabs poqday 1-2, 2 tabs poqday 3-4, 1 tab poqday 5-6 12 tablet 0   No facility-administered medications prior to visit.    Allergies:  Allergies  Allergen Reactions  . Latex Anaphylaxis    Social History   Social History  . Marital Status: Divorced    Spouse Name: N/A  . Number  of Children: N/A  . Years of Education: N/A   Occupational History  . Not on file.   Social History Main Topics  . Smoking status: Never Smoker   . Smokeless tobacco: Never Used  . Alcohol Use: Yes     Comment:  2-3 glasses per week - wine  . Drug Use: No  . Sexual Activity: Yes    Birth Control/ Protection: Surgical   Other Topics Concern  .  Not on file   Social History Narrative    Family History  Problem Relation Age of Onset  . Cancer Father   . Lymphoma Father   . Hypertension Sister     Physical Exam: Blood pressure 114/76, pulse 80, temperature 97.8 F (36.6 C), temperature source Oral, resp. rate 18, height 5\' 2"  (1.575 m), weight 149 lb (67.586 kg)., Body mass index is 27.25 kg/(m^2). General: Well developed, well nourished WF. Appears in no acute distress. HEENT: Normocephalic, atraumatic. Conjunctiva pink, sclera non-icteric. Pupils 2 mm constricting to 1 mm, round, regular, and equally reactive to light and accomodation. EOMI. Internal auditory canal clear. TMs with good cone of light and without pathology. Nasal mucosa pink. Nares are without discharge. No sinus tenderness. Oral mucosa pink.  Pharynx without exudate.   Neck: Supple. Trachea midline. No thyromegaly. Full ROM. No lymphadenopathy.No Carotid Bruits. Lungs: Clear to auscultation bilaterally without wheezes, rales, or rhonchi. Breathing is of normal effort and unlabored. Cardiovascular: RRR with S1 S2. No murmurs, rubs, or gallops. Distal pulses 2+ symmetrically. No carotid or abdominal bruits. Breast: Per Gyn Abdomen: Soft, non-tender, non-distended with normoactive bowel sounds. No hepatosplenomegaly or masses. No rebound/guarding. No CVA tenderness. No hernias.  Genitourinary:  Per Gyn.  Musculoskeletal: Full range of motion and 5/5 strength throughout. Skin: Warm and moist without erythema, ecchymosis, wounds, or rash. Neuro: A+Ox3. CN II-XII grossly intact. Moves all extremities spontaneously. Full sensation throughout. Normal gait. DTR 2+ throughout upper and lower extremities.  Psych:  Responds to questions appropriately with a normal affect.   Assessment/Plan:  55 y.o. y/o female here for CPE  1. Visit for preventive health examination A. Screening Labs: She is fasting. Will check labs now. - CBC with Differential/Platelet - COMPLETE  METABOLIC PANEL WITH GFR - Lipid panel - TSH - VITAMIN D 25 Hydroxy (Vit-D Deficiency, Fractures)  B. Pap: Per Gyn  C. Screening Mammogram: She states that she just had mammogram last week. Has appointment with GYN Dr. Willis Modena for March. Will have breast exam there.  D. DEXA/BMD:  Can wait until closer to age 73 to start this.  E. Colorectal Cancer Screening: She has never had colonoscopy. She is agreeable to see GI to schedule colonoscopy. - Ambulatory referral to Gastroenterology  F. 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 Had influenza vaccine for this flu season. States that she knows that tetanus vaccine is           up-to-date. Influenza: Tetanus: Pneumococcal: Zostavax:  Follow-up in one year or sooner if needed.  [address] [place], [place], [facility] 01/20/2016 4:32 PM

## 2016-01-21 ENCOUNTER — Encounter: Payer: Self-pay | Admitting: Family Medicine

## 2016-01-21 LAB — COMPLETE METABOLIC PANEL WITH GFR
ALBUMIN: 4.5 g/dL (ref 3.6–5.1)
ALK PHOS: 72 U/L (ref 33–130)
ALT: 37 U/L — ABNORMAL HIGH (ref 6–29)
AST: 28 U/L (ref 10–35)
BUN: 11 mg/dL (ref 7–25)
CHLORIDE: 102 mmol/L (ref 98–110)
CO2: 27 mmol/L (ref 20–31)
Calcium: 9.6 mg/dL (ref 8.6–10.4)
Creat: 0.7 mg/dL (ref 0.50–1.05)
GFR, Est African American: >89 mL/min (ref >=60)
GLUCOSE: 87 mg/dL (ref 70–99)
POTASSIUM: 4.7 mmol/L (ref 3.5–5.3)
SODIUM: 139 mmol/L (ref 135–146)
Total Bilirubin: 0.5 mg/dL (ref 0.2–1.2)
Total Protein: 7.7 g/dL (ref 6.1–8.1)

## 2016-01-21 LAB — LIPID PANEL
CHOL/HDL RATIO: 4.4 ratio (ref <=5.0)
Cholesterol: 226 mg/dL — ABNORMAL HIGH (ref 125–200)
HDL: 51 mg/dL (ref >=46)
LDL Cholesterol: 158 mg/dL — ABNORMAL HIGH (ref <130)
Triglycerides: 87 mg/dL (ref <150)
VLDL: 17 mg/dL (ref <30)

## 2016-01-21 LAB — CBC WITH DIFFERENTIAL/PLATELET
BASOS PCT: 0 % (ref 0–1)
Basophils Absolute: 0 10*3/uL (ref 0.0–0.1)
EOS ABS: 0.1 10*3/uL (ref 0.0–0.7)
EOS PCT: 1 % (ref 0–5)
HCT: 47.8 % — ABNORMAL HIGH (ref 36.0–46.0)
Hemoglobin: 16.3 g/dL — ABNORMAL HIGH (ref 12.0–15.0)
Lymphocytes Relative: 31 % (ref 12–46)
Lymphs Abs: 2 10*3/uL (ref 0.7–4.0)
MCH: 29.6 pg (ref 26.0–34.0)
MCHC: 34.1 g/dL (ref 30.0–36.0)
MCV: 86.9 fL (ref 78.0–100.0)
MONO ABS: 0.6 10*3/uL (ref 0.1–1.0)
MONOS PCT: 10 % (ref 3–12)
MPV: 10.6 fL (ref 8.6–12.4)
Neutro Abs: 3.7 10*3/uL (ref 1.7–7.7)
Neutrophils Relative %: 58 % (ref 43–77)
PLATELETS: 319 10*3/uL (ref 150–400)
RBC: 5.5 MIL/uL — AB (ref 3.87–5.11)
RDW: 13.3 % (ref 11.5–15.5)
WBC: 6.4 10*3/uL (ref 4.0–10.5)

## 2016-01-21 LAB — VITAMIN D 25 HYDROXY (VIT D DEFICIENCY, FRACTURES): Vit D, 25-Hydroxy: 39 ng/mL (ref 30–100)

## 2016-01-21 LAB — TSH: TSH: 1.94 m[IU]/L

## 2016-01-26 DIAGNOSIS — H524 Presbyopia: Secondary | ICD-10-CM | POA: Diagnosis not present

## 2016-01-26 DIAGNOSIS — H5213 Myopia, bilateral: Secondary | ICD-10-CM | POA: Diagnosis not present

## 2016-01-26 DIAGNOSIS — H2513 Age-related nuclear cataract, bilateral: Secondary | ICD-10-CM | POA: Diagnosis not present

## 2016-01-26 DIAGNOSIS — H52222 Regular astigmatism, left eye: Secondary | ICD-10-CM | POA: Diagnosis not present

## 2016-09-27 DIAGNOSIS — D225 Melanocytic nevi of trunk: Secondary | ICD-10-CM | POA: Diagnosis not present

## 2016-09-27 DIAGNOSIS — L821 Other seborrheic keratosis: Secondary | ICD-10-CM | POA: Diagnosis not present

## 2016-09-27 DIAGNOSIS — L820 Inflamed seborrheic keratosis: Secondary | ICD-10-CM | POA: Diagnosis not present

## 2016-09-27 DIAGNOSIS — L811 Chloasma: Secondary | ICD-10-CM | POA: Diagnosis not present

## 2016-09-27 DIAGNOSIS — L570 Actinic keratosis: Secondary | ICD-10-CM | POA: Diagnosis not present

## 2017-10-12 ENCOUNTER — Other Ambulatory Visit: Payer: Self-pay

## 2017-10-12 ENCOUNTER — Ambulatory Visit (INDEPENDENT_AMBULATORY_CARE_PROVIDER_SITE_OTHER): Payer: 59 | Admitting: Physician Assistant

## 2017-10-12 ENCOUNTER — Encounter: Payer: Self-pay | Admitting: Physician Assistant

## 2017-10-12 VITALS — BP 140/84 | HR 80 | Temp 98.1°F | Resp 16 | Ht 61.0 in | Wt 151.8 lb

## 2017-10-12 DIAGNOSIS — Z114 Encounter for screening for human immunodeficiency virus [HIV]: Secondary | ICD-10-CM | POA: Diagnosis not present

## 2017-10-12 DIAGNOSIS — Z1159 Encounter for screening for other viral diseases: Secondary | ICD-10-CM | POA: Diagnosis not present

## 2017-10-12 DIAGNOSIS — Z0000 Encounter for general adult medical examination without abnormal findings: Secondary | ICD-10-CM | POA: Diagnosis not present

## 2017-10-12 NOTE — Progress Notes (Signed)
Patient ID: Traci Roberts MRN: [id number], DOB: 08/27/1961, 56 y.o. Date of Encounter: 10/12/2017,   Chief Complaint: Physical (CPE)  HPI: 56 y.o. y/o white female  here for CPE.   01/20/2016: She has not had an office visit here in a couple years. She states that she has not been seeing any other medical providers during that time. Says that she realizes that she really needs to come in and have a physical and update preventive care.  Says that she does have an appointment to see Dr. Willis Modena for GYN exam--says that appointment is in March.  She has no complaints or concerns today.  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   10/12/2017: Reports that she has no specific concerns to address today.  No updates regarding her health/medical care. Says that she did not end up going to that appointment with Dr. Willis Modena for GYN exam.  Says that she has not had GYN exam in several years so does need to do Pap smear with me today. I reviewed that at her labs at last physical 12/2015 LDL was 158 and I recommended decreasing saturated fats in diet.  Today I asked if she felt like she had made much change.  She says that she has made minimal change.  That she does try to watch what she eats and keep her weight under control but has not made any drastic changes in the past year. 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     Review of Systems: Consitutional: No fever, chills, fatigue, night sweats, lymphadenopathy. No significant/unexplained weight changes. Eyes: No visual changes, eye redness, or discharge. ENT/Mouth: No ear pain, sore throat, nasal drainage, or sinus pain. Cardiovascular: No chest pressure,heaviness, tightness or squeezing, even with exertion. No increased shortness of breath or dyspnea on exertion.No palpitations, edema, orthopnea, PND. Respiratory: No cough, hemoptysis, SOB, or  wheezing. Gastrointestinal: No anorexia, dysphagia, reflux, pain, nausea, vomiting, hematemesis, diarrhea, constipation, BRBPR, or melena. Breast: No mass, nodules, bulging, or retraction. No skin changes or inflammation. No nipple discharge. No lymphadenopathy. Genitourinary: No dysuria, hematuria, incontinence, vaginal discharge, pruritis, burning, abnormal bleeding, or pain. Musculoskeletal: No decreased ROM, No joint pain or swelling. No significant pain in neck, back, or extremities. Skin: No rash, pruritis, or concerning lesions. Neurological: No headache, dizziness, syncope, seizures, tremors, memory loss, coordination problems, or paresthesias. Psychological: No anxiety, depression, hallucinations, SI/HI. Endocrine: No polydipsia, polyphagia, polyuria, or known diabetes.No increased fatigue. No palpitations/rapid heart rate. No significant/unexplained weight change. All other systems were reviewed and are otherwise negative.  Past Medical History:  Diagnosis Date  . Anxiety    no meds  . Arrhythmia   . ASD (atrial septal defect)    F/U with Dr Aundra Dubin in July 2012  . Headache(784.0)    OTC meds prn  . Heart murmur   . History of migraines   . PONV (postoperative nausea and vomiting)   . PVC's (premature ventricular contractions)      Past Surgical History:  Procedure Laterality Date  . CARPAL TUNNEL RELEASE    . LAPAROSCOPY    . POSTERIOR REPAIR    . svd     x 2  . TUBAL LIGATION    . WISDOM TOOTH EXTRACTION      Home Meds:  Outpatient Medications Prior to Visit  Medication Sig Dispense Refill  . calcium-vitamin D (OSCAL WITH  D) 500-200 MG-UNIT per tablet Take 1 tablet by mouth daily.      . Multiple Vitamins-Minerals (MULTIVITAMIN WITH MINERALS) tablet Take 1 tablet by mouth daily.      Marland Kitchen GLUCOSAMINE HCL-MSM PO Take 1 tablet by mouth daily. 1500 mg each    . loratadine (CLARITIN) 10 MG tablet Take 1 tablet (10 mg total) by mouth daily. 30 tablet 11   No  facility-administered medications prior to visit.     Allergies:  Allergies  Allergen Reactions  . Latex Anaphylaxis    Social History   Socioeconomic History  . Marital status: Divorced    Spouse name: Not on file  . Number of children: Not on file  . Years of education: Not on file  . Highest education level: Not on file  Social Needs  . Financial resource strain: Not on file  . Food insecurity - worry: Not on file  . Food insecurity - inability: Not on file  . Transportation needs - medical: Not on file  . Transportation needs - non-medical: Not on file  Occupational History  . Not on file  Tobacco Use  . Smoking status: Never Smoker  . Smokeless tobacco: Never Used  Substance and Sexual Activity  . Alcohol use: Yes    Comment:  2-3 glasses per week - wine  . Drug use: No  . Sexual activity: Yes    Birth control/protection: Surgical  Other Topics Concern  . Not on file  Social History Narrative  . Not on file    Family History  Problem Relation Age of Onset  . Cancer Father   . Lymphoma Father   . Diabetes Mother   . Hypertension Mother   . Hypertension Sister   . Hypertension Sister     Physical Exam: Blood pressure 140/84, pulse 80, temperature 98.1 F (36.7 C), temperature source Oral, resp. rate 16, height 5\' 1"  (1.549 m), weight 68.9 kg (151 lb 12.8 oz), SpO2 98 %., Body mass index is 28.68 kg/m. General: Well developed, well nourished WF. Appears in no acute distress. HEENT: Normocephalic, atraumatic. Conjunctiva pink, sclera non-icteric. Pupils 2 mm constricting to 1 mm, round, regular, and equally reactive to light and accomodation. EOMI. Internal auditory canal clear. TMs with good cone of light and without pathology. Nasal mucosa pink. Nares are without discharge. No sinus tenderness. Oral mucosa pink.  Pharynx without exudate.   Neck: Supple. Trachea midline. No thyromegaly. Full ROM. No lymphadenopathy.No Carotid Bruits. Lungs: Clear to  auscultation bilaterally without wheezes, rales, or rhonchi. Breathing is of normal effort and unlabored. Cardiovascular: RRR with S1 S2. No murmurs, rubs, or gallops. Distal pulses 2+ symmetrically. No carotid or abdominal bruits. Breast: Breasts are soft with no masses.  No skin changes.  No nipple discharge. Abdomen: Soft, non-tender, non-distended with normoactive bowel sounds. No hepatosplenomegaly or masses. No rebound/guarding. No CVA tenderness. No hernias.  Genitourinary: External genitalia is normal.  Vaginal mucosa normal.  Cervix normal.  Bimanual exam is normal with no mass. Musculoskeletal: Full range of motion and 5/5 strength throughout. Skin: Warm and moist without erythema, ecchymosis, wounds, or rash. Neuro: A+Ox3. CN II-XII grossly intact. Moves all extremities spontaneously. Full sensation throughout. Normal gait.  Psych:  Responds to questions appropriately with a normal affect.   Assessment/Plan:  56 y.o. y/o female here for CPE  1. Visit for preventive health examination A. Screening Labs: 10/12/2017:She is fasting. Will check labs now. - CBC with Differential/Platelet - COMPLETE METABOLIC PANEL WITH GFR -  Lipid panel - TSH - VITAMIN D 25 Hydroxy (Vit-D Deficiency, Fractures) - HIV antibody - Hepatitis C antibody  B. Pap: 10/12/2017:- PAP, Thin Prep w/HPV rflx HPV Type 16/18  C. Screening Mammogram: 10/12/2017:Last mammogram was 12/2015.  Agreeable for me to schedule follow-up mammogram. - MM DIGITAL SCREENING BILATERAL; Future  D. DEXA/BMD:  10/12/2017:Can wait until closer to age 26 to start this.  E. Colorectal Cancer Screening: 12/2015:She has never had colonoscopy. She is agreeable to see GI to schedule colonoscopy. - Ambulatory referral to Gastroenterology 10/12/2017: She ended up canceling and did not go for colonoscopy.  Discussed different types of screening today.  Discussed that the colonoscopy is the best screening tool but did discuss Cologuard  and Hemoccult.  At this time she is agreeable to do the Cologuard and will proceed with this.  This was given to her today.  F. 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 Had influenza vaccine for this flu season. States that she knows that tetanus vaccine is           up-to-date. Influenza: Tetanus: Pneumococcal: Zostavax:   2. Need for hepatitis C screening test - Hepatitis C antibody  3. Screening for HIV (human immunodeficiency virus) - HIV antibody  Follow-up in one year or sooner if needed.    Signed, [address] [place], [place], [facility] 10/12/2017 10:51 AM

## 2017-10-13 LAB — COMPLETE METABOLIC PANEL WITH GFR
AG RATIO: 1.5 (calc) (ref 1.0–2.5)
ALBUMIN MSPROF: 4.4 g/dL (ref 3.6–5.1)
ALT: 32 U/L — AB (ref 6–29)
AST: 22 U/L (ref 10–35)
Alkaline phosphatase (APISO): 90 U/L (ref 33–130)
BILIRUBIN TOTAL: 0.4 mg/dL (ref 0.2–1.2)
BUN: 18 mg/dL (ref 7–25)
CHLORIDE: 104 mmol/L (ref 98–110)
CO2: 21 mmol/L (ref 20–32)
Calcium: 9.3 mg/dL (ref 8.6–10.4)
Creat: 0.75 mg/dL (ref 0.50–1.05)
GFR, EST AFRICAN AMERICAN: 104 mL/min/{1.73_m2} (ref >=60)
GFR, Est Non African American: 90 mL/min/{1.73_m2} (ref >=60)
GLOBULIN: 3 g/dL (ref 1.9–3.7)
Glucose, Bld: 97 mg/dL (ref 65–99)
POTASSIUM: 4.8 mmol/L (ref 3.5–5.3)
SODIUM: 139 mmol/L (ref 135–146)
TOTAL PROTEIN: 7.4 g/dL (ref 6.1–8.1)

## 2017-10-13 LAB — LIPID PANEL
Cholesterol: 217 mg/dL — ABNORMAL HIGH (ref <200)
HDL: 51 mg/dL (ref >50)
LDL Cholesterol (Calc): 138 mg/dL (calc) — ABNORMAL HIGH
Non-HDL Cholesterol (Calc): 166 mg/dL (calc) — ABNORMAL HIGH (ref <130)
Total CHOL/HDL Ratio: 4.3 (calc) (ref <5.0)
Triglycerides: 150 mg/dL — ABNORMAL HIGH (ref <150)

## 2017-10-13 LAB — CBC WITH DIFFERENTIAL/PLATELET
BASOS ABS: 44 {cells}/uL (ref 0–200)
BASOS PCT: 0.6 %
EOS ABS: 88 {cells}/uL (ref 15–500)
EOS PCT: 1.2 %
HEMATOCRIT: 46.7 % — AB (ref 35.0–45.0)
HEMOGLOBIN: 16.1 g/dL — AB (ref 11.7–15.5)
LYMPHS ABS: 2154 {cells}/uL (ref 850–3900)
MCH: 29.6 pg (ref 27.0–33.0)
MCHC: 34.5 g/dL (ref 32.0–36.0)
MCV: 85.8 fL (ref 80.0–100.0)
MPV: 11.2 fL (ref 7.5–12.5)
Monocytes Relative: 7.9 %
NEUTROS ABS: 4438 {cells}/uL (ref 1500–7800)
Neutrophils Relative %: 60.8 %
Platelets: 287 10*3/uL (ref 140–400)
RBC: 5.44 10*6/uL — ABNORMAL HIGH (ref 3.80–5.10)
RDW: 13 % (ref 11.0–15.0)
Total Lymphocyte: 29.5 %
WBC mixed population: 577 cells/uL (ref 200–950)
WBC: 7.3 10*3/uL (ref 3.8–10.8)

## 2017-10-13 LAB — HEPATITIS C ANTIBODY
Hepatitis C Ab: NONREACTIVE
SIGNAL TO CUT-OFF: 0.02 (ref <1.00)

## 2017-10-13 LAB — TSH: TSH: 2.74 mIU/L

## 2017-10-13 LAB — HIV ANTIBODY (ROUTINE TESTING W REFLEX): HIV 1&2 Ab, 4th Generation: NONREACTIVE

## 2017-10-18 LAB — PAP, TP IMAGING W/ HPV RNA, RFLX HPV TYPE 16,18/45: HPV DNA HIGH RISK: NOT DETECTED

## 2017-11-13 ENCOUNTER — Ambulatory Visit
Admission: RE | Admit: 2017-11-13 | Discharge: 2017-11-13 | Disposition: A | Discharge status: Home / Self Care | Payer: 59 | Source: Ambulatory Visit | Attending: Physician Assistant | Admitting: Physician Assistant

## 2017-11-13 DIAGNOSIS — Z0000 Encounter for general adult medical examination without abnormal findings: Secondary | ICD-10-CM

## 2017-11-13 DIAGNOSIS — Z1231 Encounter for screening mammogram for malignant neoplasm of breast: Secondary | ICD-10-CM | POA: Diagnosis not present

## 2017-11-14 DIAGNOSIS — L814 Other melanin hyperpigmentation: Secondary | ICD-10-CM | POA: Diagnosis not present

## 2017-11-14 DIAGNOSIS — D225 Melanocytic nevi of trunk: Secondary | ICD-10-CM | POA: Diagnosis not present

## 2017-11-14 DIAGNOSIS — D1801 Hemangioma of skin and subcutaneous tissue: Secondary | ICD-10-CM | POA: Diagnosis not present

## 2017-11-14 DIAGNOSIS — L821 Other seborrheic keratosis: Secondary | ICD-10-CM | POA: Diagnosis not present

## 2017-11-14 DIAGNOSIS — D2372 Other benign neoplasm of skin of left lower limb, including hip: Secondary | ICD-10-CM | POA: Diagnosis not present

## 2017-12-12 ENCOUNTER — Telehealth: Payer: Self-pay

## 2017-12-12 NOTE — Telephone Encounter (Signed)
Fax received from [facility] indicating that patient did not want to complete the screening because she reported having a  previous screening. The order was cancelled

## 2017-12-15 ENCOUNTER — Other Ambulatory Visit: Payer: Self-pay | Admitting: Family Medicine

## 2017-12-15 ENCOUNTER — Telehealth: Payer: Self-pay | Admitting: Family Medicine

## 2017-12-15 MED ORDER — ALPRAZOLAM 0.5 MG PO TABS: 0.5000 mg | ORAL_TABLET | Freq: Three times a day (TID) | ORAL | 0 refills | Status: AC | PRN

## 2017-12-15 MED FILL — ALPRAZolam 0.5 MG TABS: 0.5 | 4 days supply | Qty: 10 | Fill #0

## 2017-12-15 NOTE — Telephone Encounter (Signed)
Patient is calling to see if she can get refill on her xanax  [facility]  Patients husband just had open heart surgery  And is pretty anxious  [phone]

## 2017-12-15 NOTE — Telephone Encounter (Signed)
Will given 10 pills but will need to be seen for more.

## 2017-12-15 NOTE — Telephone Encounter (Signed)
Med sent to pharm and pt aware 

## 2017-12-15 NOTE — Telephone Encounter (Signed)
Looks like the last time we give it to her was 05/09/14. Not on current med list.

## 2018-03-05 ENCOUNTER — Encounter: Payer: 59 | Admitting: Family Medicine

## 2018-04-09 DIAGNOSIS — H5213 Myopia, bilateral: Secondary | ICD-10-CM | POA: Diagnosis not present

## 2018-10-22 ENCOUNTER — Telehealth: Payer: 59 | Admitting: Family

## 2018-10-22 DIAGNOSIS — J029 Acute pharyngitis, unspecified: Secondary | ICD-10-CM

## 2018-10-22 NOTE — Progress Notes (Signed)
We are sorry that you are not feeling well.  Here is how we plan to help!  Based on what you have shared with me it looks like you have a condition called pharyngitis.  Pharyngitis is inflammation in the back of the throat which can cause a sore throat, scratchiness and sometimes difficulty swallowing.   Pharyngitis is typically caused by a respiratory virus and will just run its course.  Please keep in mind that your symptoms could last up to 10 days.  For throat pain, we recommend over the counter oral pain relief medications such as acetaminophen or aspirin, or anti-inflammatory medications such as ibuprofen or naproxen sodium.  Topical treatments such as oral throat lozenges or sprays may be used as needed.  Avoid close contact with loved ones, especially the very young and elderly.  Remember to wash your hands thoroughly throughout the day as this is the number one way to prevent the spread of infection and wipe down door knobs and counters with disinfectant.  After careful review of your answers, I would not recommend and antibiotic for your condition.  Antibiotics should not be used to treat conditions that we suspect are caused by viruses like the virus that causes the common cold or flu.  Providers prescribe antibiotics to treat infections caused by bacteria. Antibiotics are very powerful in treating bacterial infections when they are used properly.  To maintain their effectiveness, they should be used only when necessary.  Overuse of antibiotics has resulted in the development of super bugs that are resistant to treatment!    Home Care:  Only take medications as instructed by your medical team.  Do not drink alcohol while taking these medications.  A steam or ultrasonic humidifier can help congestion.  You can place a towel over your head and breathe in the steam from hot water coming from a faucet.  Avoid close contacts especially the very young and the elderly.  Cover your mouth when  you cough or sneeze.  Always remember to wash your hands.  Get Help Right Away If:  You develop worsening fever or throat pain.  You develop a severe head ache or visual changes.  Your symptoms persist after you have completed your treatment plan.  Make sure you  Understand these instructions.  Will watch your condition.  Will get help right away if you are not doing well or get worse.  Your e-visit answers were reviewed by a board certified advanced clinical practitioner to complete your personal care plan.  Depending on the condition, your plan could have included both over the counter or prescription medications.  If there is a problem please reply  once you have received a response from your provider.  Your safety is important to [place].  If you have drug allergies check your prescription carefully.    You can use MyChart to ask questions about todays visit, request a non-urgent call back, or ask for a work or school excuse for 24 hours related to this e-Visit. If it has been greater than 24 hours you will need to follow up with your provider, or enter a new e-Visit to address those concerns.  You will get an e-mail in the next two days asking about your experience.  I hope that your e-visit has been valuable and will speed your recovery. Thank you for using e-visits.
